# Patient Record
Sex: Male | Born: 1955 | Race: Black or African American | Hispanic: No | Marital: Married | Smoking: Never smoker
Health system: Southern US, Community
[De-identification: ages and names within clinical notes are randomized; demographics above are authoritative.]

## PROBLEM LIST (undated history)

## (undated) DIAGNOSIS — T7840XA Allergy, unspecified, initial encounter: Secondary | ICD-10-CM

## (undated) HISTORY — DX: Allergy, unspecified, initial encounter: T78.40XA

---

## 2014-10-08 ENCOUNTER — Ambulatory Visit (INDEPENDENT_AMBULATORY_CARE_PROVIDER_SITE_OTHER): Payer: Worker's Compensation | Admitting: Urgent Care

## 2014-10-08 VITALS — BP 128/82 | HR 84 | Temp 98.1°F | Resp 18

## 2014-10-08 DIAGNOSIS — S61218A Laceration without foreign body of other finger without damage to nail, initial encounter: Secondary | ICD-10-CM | POA: Insufficient documentation

## 2014-10-08 DIAGNOSIS — M79645 Pain in left finger(s): Secondary | ICD-10-CM | POA: Diagnosis not present

## 2014-10-08 NOTE — Patient Instructions (Addendum)
Laceration Care, Adult A laceration is a cut or lesion that goes through all layers of the skin and into the tissue just beneath the skin. TREATMENT  Some lacerations may not require closure. Some lacerations may not be able to be closed due to an increased risk of infection. It is important to see your caregiver as soon as possible after an injury to minimize the risk of infection and maximize the opportunity for successful closure. If closure is appropriate, pain medicines may be given, if needed. The wound will be cleaned to help prevent infection. Your caregiver will use stitches (sutures), staples, wound glue (adhesive), or skin adhesive strips to repair the laceration. These tools bring the skin edges together to allow for faster healing and a better cosmetic outcome. However, all wounds will heal with a scar. Once the wound has healed, scarring can be minimized by covering the wound with sunscreen during the day for 1 full year. HOME CARE INSTRUCTIONS  For sutures or staples:  Keep the wound clean and dry.  If you were given a bandage (dressing), you should change it at least once a day. Also, change the dressing if it becomes wet or dirty, or as directed by your caregiver.  Wash the wound with soap and water 2 times a day. Rinse the wound off with water to remove all soap. Pat the wound dry with a clean towel.  After cleaning, apply a thin layer of the antibiotic ointment as recommended by your caregiver. This will help prevent infection and keep the dressing from sticking.  You may shower as usual after the first 24 hours. Do not soak the wound in water until the sutures are removed.  Only take over-the-counter or prescription medicines for pain, discomfort, or fever as directed by your caregiver.  Get your sutures or staples removed as directed by your caregiver. For skin adhesive strips:  Keep the wound clean and dry.  Do not get the skin adhesive strips wet. You may bathe  carefully, using caution to keep the wound dry.  If the wound gets wet, pat it dry with a clean towel.  Skin adhesive strips will fall off on their own. You may trim the strips as the wound heals. Do not remove skin adhesive strips that are still stuck to the wound. They will fall off in time. For wound adhesive:  You may briefly wet your wound in the shower or bath. Do not soak or scrub the wound. Do not swim. Avoid periods of heavy perspiration until the skin adhesive has fallen off on its own. After showering or bathing, gently pat the wound dry with a clean towel.  Do not apply liquid medicine, cream medicine, or ointment medicine to your wound while the skin adhesive is in place. This may loosen the film before your wound is healed.  If a dressing is placed over the wound, be careful not to apply tape directly over the skin adhesive. This may cause the adhesive to be pulled off before the wound is healed.  Avoid prolonged exposure to sunlight or tanning lamps while the skin adhesive is in place. Exposure to ultraviolet light in the first year will darken the scar.  The skin adhesive will usually remain in place for 5 to 10 days, then naturally fall off the skin. Do not pick at the adhesive film. You may need a tetanus shot if:  You cannot remember when you had your last tetanus shot.  You have never had a tetanus   shot. If you get a tetanus shot, your arm may swell, get red, and feel warm to the touch. This is common and not a problem. If you need a tetanus shot and you choose not to have one, there is a rare chance of getting tetanus. Sickness from tetanus can be serious. SEEK MEDICAL CARE IF:   You have redness, swelling, or increasing pain in the wound.  You see a red line that goes away from the wound.  You have yellowish-white fluid (pus) coming from the wound.  You have a fever.  You notice a bad smell coming from the wound or dressing.  Your wound breaks open before or  after sutures have been removed.  You notice something coming out of the wound such as wood or glass.  Your wound is on your hand or foot and you cannot move a finger or toe. SEEK IMMEDIATE MEDICAL CARE IF:   Your pain is not controlled with prescribed medicine.  You have severe swelling around the wound causing pain and numbness or a change in color in your arm, hand, leg, or foot.  Your wound splits open and starts bleeding.  You have worsening numbness, weakness, or loss of function of any joint around or beyond the wound.  You develop painful lumps near the wound or on the skin anywhere on your body. MAKE SURE YOU:   Understand these instructions.  Will watch your condition.  Will get help right away if you are not doing well or get worse. Document Released: 01/22/2005 Document Revised: 04/16/2011 Document Reviewed: 07/18/2010 ExitCare Patient Information 2015 [employer]. This information is not intended to replace advice given to you by your health care provider. Make sure you discuss any questions you have with your health care provider.  Tdap Vaccine (Tetanus, Diphtheria, Pertussis): What You Need to Know 1. Why get vaccinated? Tetanus, diphtheria and pertussis can be very serious diseases, even for adolescents and adults. Tdap vaccine can protect us from these diseases. TETANUS (Lockjaw) causes painful muscle tightening and stiffness, usually all over the body.  It can lead to tightening of muscles in the head and neck so you can't open your mouth, swallow, or sometimes even breathe. Tetanus kills about 1 out of 5 people who are infected. DIPHTHERIA can cause a thick coating to form in the back of the throat.  It can lead to breathing problems, paralysis, heart failure, and death. PERTUSSIS (Whooping Cough) causes severe coughing spells, which can cause difficulty breathing, vomiting and disturbed sleep.  It can also lead to weight loss, incontinence, and rib  fractures. Up to 2 in 100 adolescents and 5 in 100 adults with pertussis are hospitalized or have complications, which could include pneumonia or death. These diseases are caused by bacteria. Diphtheria and pertussis are spread from person to person through coughing or sneezing. Tetanus enters the body through cuts, scratches, or wounds. Before vaccines, the [place] saw as many as 200,000 cases a year of diphtheria and pertussis, and hundreds of cases of tetanus. Since vaccination began, tetanus and diphtheria have dropped by about 99% and pertussis by about 80%. 2. Tdap vaccine Tdap vaccine can protect adolescents and adults from tetanus, diphtheria, and pertussis. One dose of Tdap is routinely given at age 11 or 12. People who did not get Tdap at that age should get it as soon as possible. Tdap is especially important for health care professionals and anyone having close contact with a baby younger than 12 months. Pregnant   women should get a dose of Tdap during every pregnancy, to protect the newborn from pertussis. Infants are most at risk for severe, life-threatening complications from pertussis. A similar vaccine, called Td, protects from tetanus and diphtheria, but not pertussis. A Td booster should be given every 10 years. Tdap may be given as one of these boosters if you have not already gotten a dose. Tdap may also be given after a severe cut or burn to prevent tetanus infection. Your doctor can give you more information. Tdap may safely be given at the same time as other vaccines. 3. Some people should not get this vaccine  If you ever had a life-threatening allergic reaction after a dose of any tetanus, diphtheria, or pertussis containing vaccine, OR if you have a severe allergy to any part of this vaccine, you should not get Tdap. Tell your doctor if you have any severe allergies.  If you had a coma, or long or multiple seizures within 7 days after a childhood dose of DTP or DTaP, you  should not get Tdap, unless a cause other than the vaccine was found. You can still get Td.  Talk to your doctor if you:  have epilepsy or another nervous system problem,  had severe pain or swelling after any vaccine containing diphtheria, tetanus or pertussis,  ever had Guillain-Barr Syndrome (GBS),  aren't feeling well on the day the shot is scheduled. 4. Risks of a vaccine reaction With any medicine, including vaccines, there is a chance of side effects. These are usually mild and go away on their own, but serious reactions are also possible. Brief fainting spells can follow a vaccination, leading to injuries from falling. Sitting or lying down for about 15 minutes can help prevent these. Tell your doctor if you feel dizzy or light-headed, or have vision changes or ringing in the ears. Mild problems following Tdap (Did not interfere with activities)  Pain where the shot was given (about 3 in 4 adolescents or 2 in 3 adults)  Redness or swelling where the shot was given (about 1 person in 5)  Mild fever of at least 100.4F (up to about 1 in 25 adolescents or 1 in 100 adults)  Headache (about 3 or 4 people in 10)  Tiredness (about 1 person in 3 or 4)  Nausea, vomiting, diarrhea, stomach ache (up to 1 in 4 adolescents or 1 in 10 adults)  Chills, body aches, sore joints, rash, swollen glands (uncommon) Moderate problems following Tdap (Interfered with activities, but did not require medical attention)  Pain where the shot was given (about 1 in 5 adolescents or 1 in 100 adults)  Redness or swelling where the shot was given (up to about 1 in 16 adolescents or 1 in 25 adults)  Fever over 102F (about 1 in 100 adolescents or 1 in 250 adults)  Headache (about 3 in 20 adolescents or 1 in 10 adults)  Nausea, vomiting, diarrhea, stomach ache (up to 1 or 3 people in 100)  Swelling of the entire arm where the shot was given (up to about 3 in 100). Severe problems following  Tdap (Unable to perform usual activities; required medical attention)  Swelling, severe pain, bleeding and redness in the arm where the shot was given (rare). A severe allergic reaction could occur after any vaccine (estimated less than 1 in a million doses). 5. What if there is a serious reaction? What should I look for?  Look for anything that concerns you, such as signs   of a severe allergic reaction, very high fever, or behavior changes. Signs of a severe allergic reaction can include hives, swelling of the face and throat, difficulty breathing, a fast heartbeat, dizziness, and weakness. These would start a few minutes to a few hours after the vaccination. What should I do?  If you think it is a severe allergic reaction or other emergency that can't wait, call 9-1-1 or get the person to the nearest hospital. Otherwise, call your doctor.  Afterward, the reaction should be reported to the "Vaccine Adverse Event Reporting System" (VAERS). Your doctor might file this report, or you can do it yourself through the VAERS web site at www.vaers.hhs.gov, or by calling 1-800-822-7967. VAERS is only for reporting reactions. They do not give medical advice.  6. The National Vaccine Injury Compensation Program The National Vaccine Injury Compensation Program (VICP) is a federal program that was created to compensate people who may have been injured by certain vaccines. Persons who believe they may have been injured by a vaccine can learn about the program and about filing a claim by calling 1-800-338-2382 or visiting the VICP website at www.hrsa.gov/vaccinecompensation. 7. How can I learn more?  Ask your doctor.  Call your local or state health department.  Contact the Centers for Disease Control and Prevention (CDC):  Call 1-800-232-4636 or visit CDC's website at www.cdc.gov/vaccines. CDC Tdap Vaccine VIS (06/14/11) Document Released: 07/24/2011 Document Revised: 06/08/2013 Document Reviewed:  05/06/2013 ExitCare Patient Information 2015 [employer]. This information is not intended to replace advice given to you by your health care provider. Make sure you discuss any questions you have with your health care provider.  

## 2014-10-08 NOTE — Progress Notes (Signed)
    MRN: [id number] DOB: 01/21/1956  Subjective:   Adam Campbell is a 59 y.o. male presenting for chief complaint of Finger Injury  Reports laceration today at work. Patient was using a tomato slicer and suffered a laceration to his left 3rd finger. He bled profusely, applied gauze and came straight to [facility]. Denies decreased range of motion, decreased sensation, foreign body sensation. He cannot recall his last TD. Denies any other aggravating or relieving factors, no other questions or concerns.  [name]'s medications list, allergies, past medical history and past surgical history were reviewed and excluded from this note due to being a worker's comp case.  Objective:   Vitals: BP 128/82 mmHg  Pulse 84  Temp(Src) 98.1 F (36.7 C) (Oral)  Resp 18  SpO2 98%  Physical Exam  Constitutional: He is oriented to person, place, and time. He appears well-developed and well-nourished.  Cardiovascular: Normal rate.   Pulmonary/Chest: Effort normal.  Musculoskeletal:       Hands: Neurological: He is alert and oriented to person, place, and time.  Skin: Skin is warm and dry. No rash noted. No erythema. No pallor.   PROCEDURE NOTE: laceration repair Verbal consent obtained from patient.  Local anesthesia with 3cc Lidocaine 2% without epinephrine.  Wound explored for tendon, ligament damage. Wound scrubbed with soap and water and rinsed. Wound closed with #5 4-0 Ethilon simple interrupted sutures.  Wound cleansed and dressed.  Assessment and Plan :   1. Laceration of middle finger, initial encounter - Stable, wound care reviewed and instructions provided, work note provided. Patient to return to clinic in 10 days for suture removal or sooner if signs of infection develop.  Wallis Bamberg, PA-C [facility] [phone] 10/08/2014 3:41 PM

## 2014-10-11 ENCOUNTER — Ambulatory Visit (INDEPENDENT_AMBULATORY_CARE_PROVIDER_SITE_OTHER): Payer: Worker's Compensation | Admitting: Family Medicine

## 2014-10-11 ENCOUNTER — Ambulatory Visit: Payer: Worker's Compensation

## 2014-10-11 VITALS — BP 136/88 | HR 81 | Temp 98.5°F | Resp 20 | Ht 67.25 in | Wt 229.8 lb

## 2014-10-11 DIAGNOSIS — M25442 Effusion, left hand: Secondary | ICD-10-CM

## 2014-10-11 DIAGNOSIS — S61218D Laceration without foreign body of other finger without damage to nail, subsequent encounter: Secondary | ICD-10-CM

## 2014-10-11 DIAGNOSIS — M7989 Other specified soft tissue disorders: Secondary | ICD-10-CM | POA: Diagnosis not present

## 2014-10-11 DIAGNOSIS — S61218A Laceration without foreign body of other finger without damage to nail, initial encounter: Secondary | ICD-10-CM | POA: Insufficient documentation

## 2014-10-11 MED ORDER — NAPROXEN SODIUM 550 MG PO TABS: 550.0000 mg | ORAL_TABLET | Freq: Two times a day (BID) | ORAL | Status: AC

## 2014-10-11 MED ORDER — NAPROXEN SODIUM 550 MG PO TABS: 550.0000 mg | ORAL_TABLET | Freq: Two times a day (BID) | ORAL | Status: DC

## 2014-10-11 MED ORDER — TETRACYCLINE HCL 250 MG PO CAPS: 250.0000 mg | ORAL_CAPSULE | Freq: Three times a day (TID) | ORAL | Status: DC

## 2014-10-11 NOTE — Patient Instructions (Signed)

## 2014-10-11 NOTE — Progress Notes (Signed)
    MRN: [id number] DOB: 07-05-1955  Subjective:   Adam Campbell is a 59 y.o. male presenting for chief complaint of Follow-up  Patient seen for finger laceration of 3rd left finger sustained while at work on 10/08/2014. He has protected his hand while at work, keeps it elevated at night. Today, reports that his 3rd finger is swelling, cannot extend it fully. Denies fever, tingling, numbness, redness, drainage of pus or bleeding. Denies any other aggravating or relieving factors, no other questions or concerns.  Dewitt's medications list, allergies, past medical history and past surgical history were reviewed and excluded from this note due to being a worker's comp case.  Objective:   Vitals: BP 136/88 mmHg  Pulse 81  Temp(Src) 98.5 F (36.9 C) (Oral)  Resp 20  Ht 5' 7.25" (1.708 m)  Wt 229 lb 12.8 oz (104.237 kg)  BMI 35.73 kg/m2  SpO2 96%  Physical Exam  Constitutional: He is oriented to person, place, and time. He appears well-developed and well-nourished.  Cardiovascular: Normal rate.   Pulmonary/Chest: Effort normal.  Musculoskeletal:       Left hand: He exhibits decreased range of motion (extension), tenderness (over dorsal aspect of finger, proximal to MCP), laceration and swelling (between MCP up to DIP). He exhibits no bony tenderness, normal capillary refill and no deformity. Normal sensation noted. Normal strength noted.       Hands: Neurological: He is alert and oriented to person, place, and time.  Skin: Skin is warm and dry. No rash noted. No erythema. No pallor.   [facility] reading (PRIMARY) by  Dr. Milus Campbell and PA-Adam Campbell. Left 3rd finger - soft tissue swelling, no signs of osteomyelitis.  Assessment and Plan :   1. Laceration of finger, middle, subsequent encounter 2. Swelling of finger, left - Stable, will cover for infectious process with tetracycline for 7 days. Provide patient with prescriptions. Advised that he start Anaprox for pain and inflammation. Recheck  on 10/18/2014 or sooner if patient develops fever, drainage of pus, bleeding, redness, streaking. Patient agreed.  Adam Bamberg, PA-C [facility] [phone] 10/11/2014 11:08 AM   History and physical exam reviewed with Mr. Adam Campbell, agree with plan and assessment.  Signed, Adam Campbell.D.

## 2014-10-19 ENCOUNTER — Ambulatory Visit (INDEPENDENT_AMBULATORY_CARE_PROVIDER_SITE_OTHER): Payer: Worker's Compensation | Admitting: Family Medicine

## 2014-10-19 VITALS — BP 130/82 | HR 85 | Temp 98.3°F | Resp 16 | Ht 67.5 in | Wt 232.0 lb

## 2014-10-19 DIAGNOSIS — M7989 Other specified soft tissue disorders: Secondary | ICD-10-CM | POA: Diagnosis not present

## 2014-10-19 DIAGNOSIS — S61218D Laceration without foreign body of other finger without damage to nail, subsequent encounter: Secondary | ICD-10-CM | POA: Diagnosis not present

## 2014-10-19 MED ORDER — AMOXICILLIN-POT CLAVULANATE 875-125 MG PO TABS: 1.0000 | ORAL_TABLET | Freq: Two times a day (BID) | ORAL | Status: AC

## 2014-10-19 NOTE — Progress Notes (Signed)
Wound finger Subjective:  Patient ID: Adam Campbell, male    DOB: 1955-03-14  Age: 59 y.o. MRN: [id number]  Patient was in here and being rechecked by Urban Gibson, PA-C for a laceration of his third finger left hand right over the PIP joint. We discussed the problem and I examined the patient with him. He had been treated here, sutures have been removed last week, and treated with doxycycline.   Objective:   Mildly erythematous PIP knuckle with healed laceration extending almost 2 cm is across the knuckle of the joint. It is a little bit tender, mildly fluctuant. Cannot fully flex the joint but especially cannot fully extend it.  Assessment & Plan:   Assessment:  Persistently swollen and mildly erythematous finger  Plan:  Change to Augmentin. Refer to hand surgery. Treatment plan discussed and agreed upon. Come in sooner if problems.   Rafel Garde, MD 10/19/2014

## 2014-10-19 NOTE — Patient Instructions (Signed)
Wound Infection °A wound infection happens when a type of germ (bacteria) starts growing in the wound. In some cases, this can cause the wound to break open. If cared for properly, the infected wound will heal from the inside to the outside. Wound infections need treatment. °CAUSES °An infection is caused by bacteria growing in the wound.  °SYMPTOMS  °· Increase in redness, swelling, or pain at the wound site. °· Increase in drainage at the wound site. °· Wound or bandage (dressing) starts to smell bad. °· Fever. °· Feeling tired or fatigued. °· Pus draining from the wound. °TREATMENT  °Your health care provider will prescribe antibiotic medicine. The wound infection should improve within 24 to 48 hours. Any redness around the wound should stop spreading and the wound should be less painful.  °HOME CARE INSTRUCTIONS  °· Only take over-the-counter or prescription medicines for pain, discomfort, or fever as directed by your health care provider. °· Take your antibiotics as directed. Finish them even if you start to feel better. °· Gently wash the area with mild soap and water 2 times a day, or as directed. Rinse off the soap. Pat the area dry with a clean towel. Do not rub the wound. This may cause bleeding. °· Follow your health care provider's instructions for how often you need to change the dressing. °· Apply ointment and a dressing to the wound as directed. °· If the dressing sticks, moisten it with soapy water and gently remove it. °· Change the bandage right away if it becomes wet, dirty, or develops a bad smell. °· Take showers. Do not take tub baths, swim, or do anything that may soak the wound until it is healed. °· Avoid exercises that make you sweat heavily. °· Use anti-itch medicine as directed by your health care provider. The wound may itch when it is healing. Do not pick or scratch at the wound. °· Follow up with your health care provider to get your wound rechecked as directed. °SEEK MEDICAL CARE  IF: °· You have an increase in swelling, pain, or redness around the wound. °· You have an increase in the amount of pus coming from the wound. °· There is a bad smell coming from the wound. °· More of the wound breaks open. °· You have a fever. °MAKE SURE YOU:  °· Understand these instructions. °· Will watch your condition. °· Will get help right away if you are not doing well or get worse. °Document Released: 10/21/2002 Document Revised: 01/27/2013 Document Reviewed: 05/28/2010 °ExitCare® Patient Information ©2015 [employer]. This information is not intended to replace advice given to you by your health care provider. Make sure you discuss any questions you have with your health care provider. ° °

## 2014-10-19 NOTE — Progress Notes (Signed)
    MRN: [id number] DOB: November 07, 1955  Subjective:   Adam Campbell is a 59 y.o. male presenting for follow up on left third finger laceration. Reports that his finger remains swollen and has difficulty with full extension. Has tried Anaprox, icing and antibiotic prescribed. Denies fever, pain, redness, drainage of pus or bleeding, numbness or tingling. Denies any other aggravating or relieving factors, no other questions or concerns.  Adam Campbell has been taking Anaprox and tetracycline.  Patient's past medical history and past surgical history reviewed and not included due to being a worker's comp case.   Objective:   Vitals: BP 130/82 mmHg  Pulse 85  Temp(Src) 98.3 F (36.8 C) (Oral)  Resp 16  Ht 5' 7.5" (1.715 m)  Wt 232 lb (105.235 kg)  BMI 35.78 kg/m2  SpO2 98%  Physical Exam  Constitutional: He is oriented to person, place, and time. He appears well-developed and well-nourished.  Cardiovascular: Normal rate.   Pulmonary/Chest: Effort normal.  Musculoskeletal:       Left hand: He exhibits decreased range of motion (3rd finger extension and flexion), laceration and swelling (between MCP up to DIP). He exhibits no tenderness, no bony tenderness, normal capillary refill and no deformity. Normal sensation noted. Normal strength noted.       Hands: Neurological: He is alert and oriented to person, place, and time.  Skin: Skin is warm and dry. No rash noted. No erythema. No pallor.   Assessment and Plan :   1. Laceration of finger, middle, subsequent encounter 2. Swelling of finger, left - Physical exam findings worrisome for persistent infection, will cover with Augmentin. - Referral to hand specialist pending.  Wallis Bamberg, PA-C [facility] [phone] 10/19/2014 3:39 PM

## 2017-02-14 IMAGING — CR DG FINGER MIDDLE 2+V*L*
2 series · 2 of 2 positions shown · non-contrast
Comparison: None.

CLINICAL DATA: 59-year-old male with soft tissue swelling involving
the left middle finger.

EXAM:
LEFT MIDDLE FINGER 2+V

[PA]
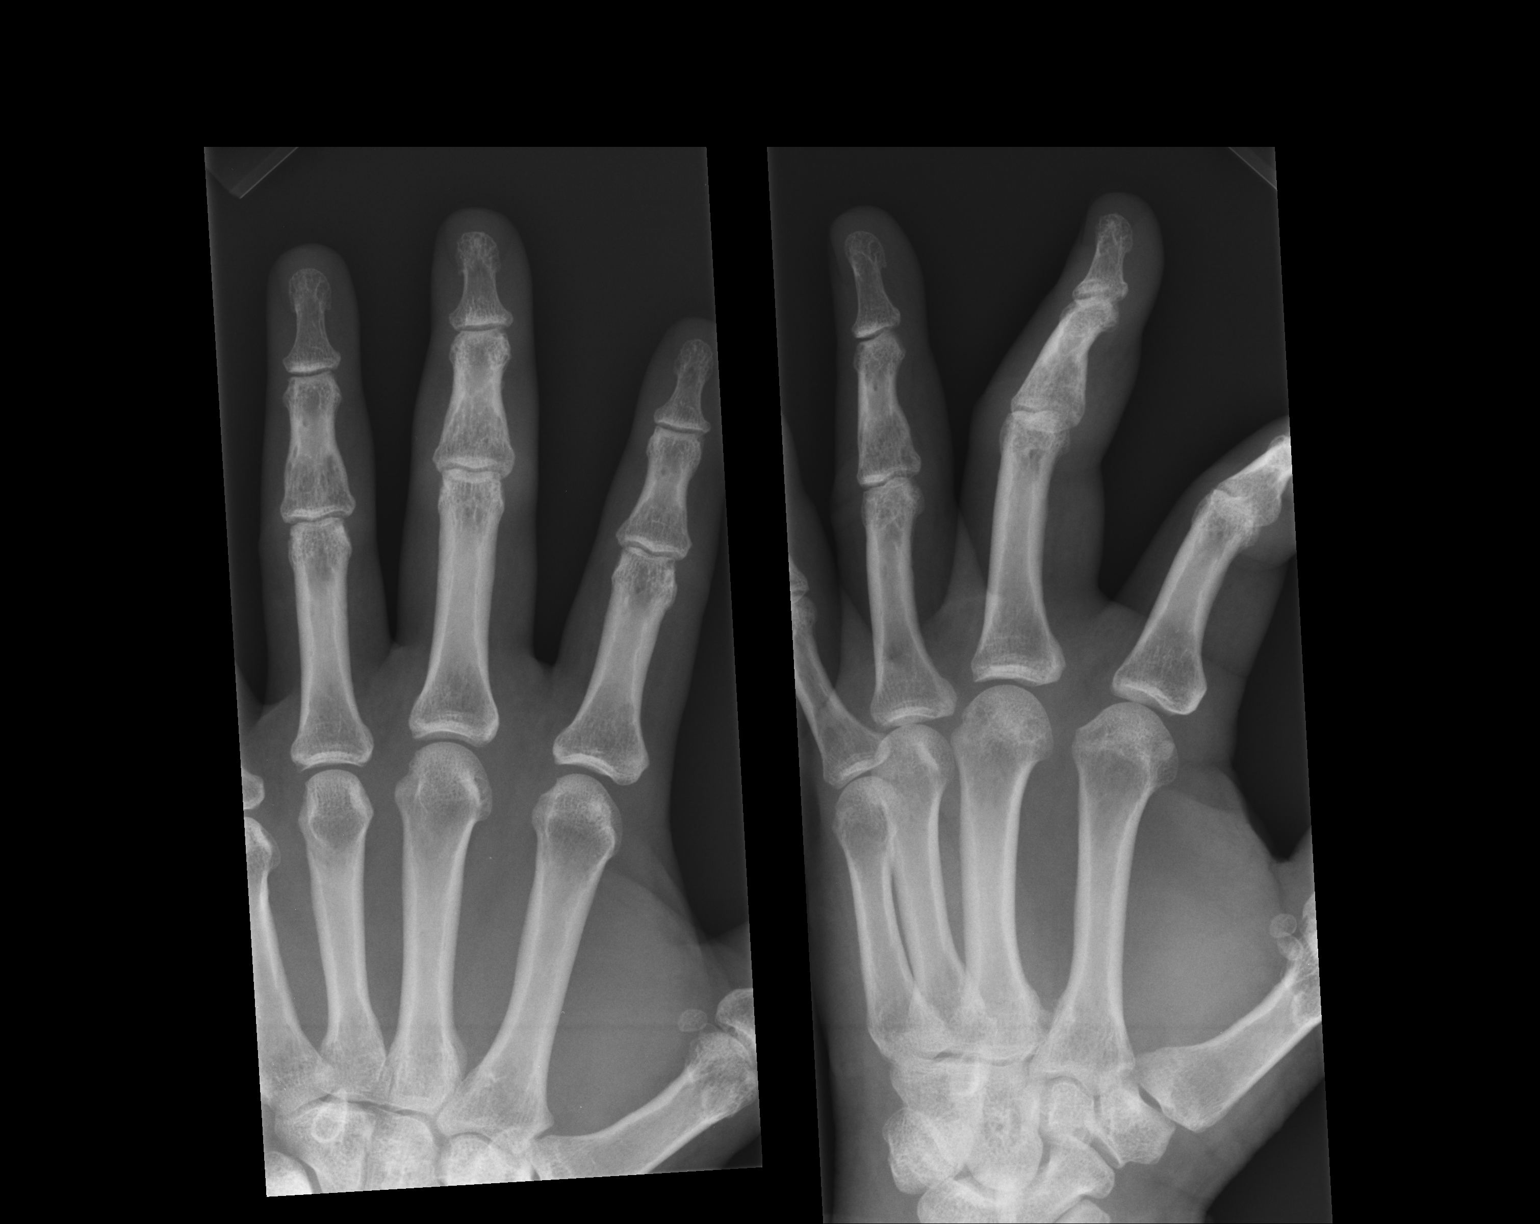

[lateral]
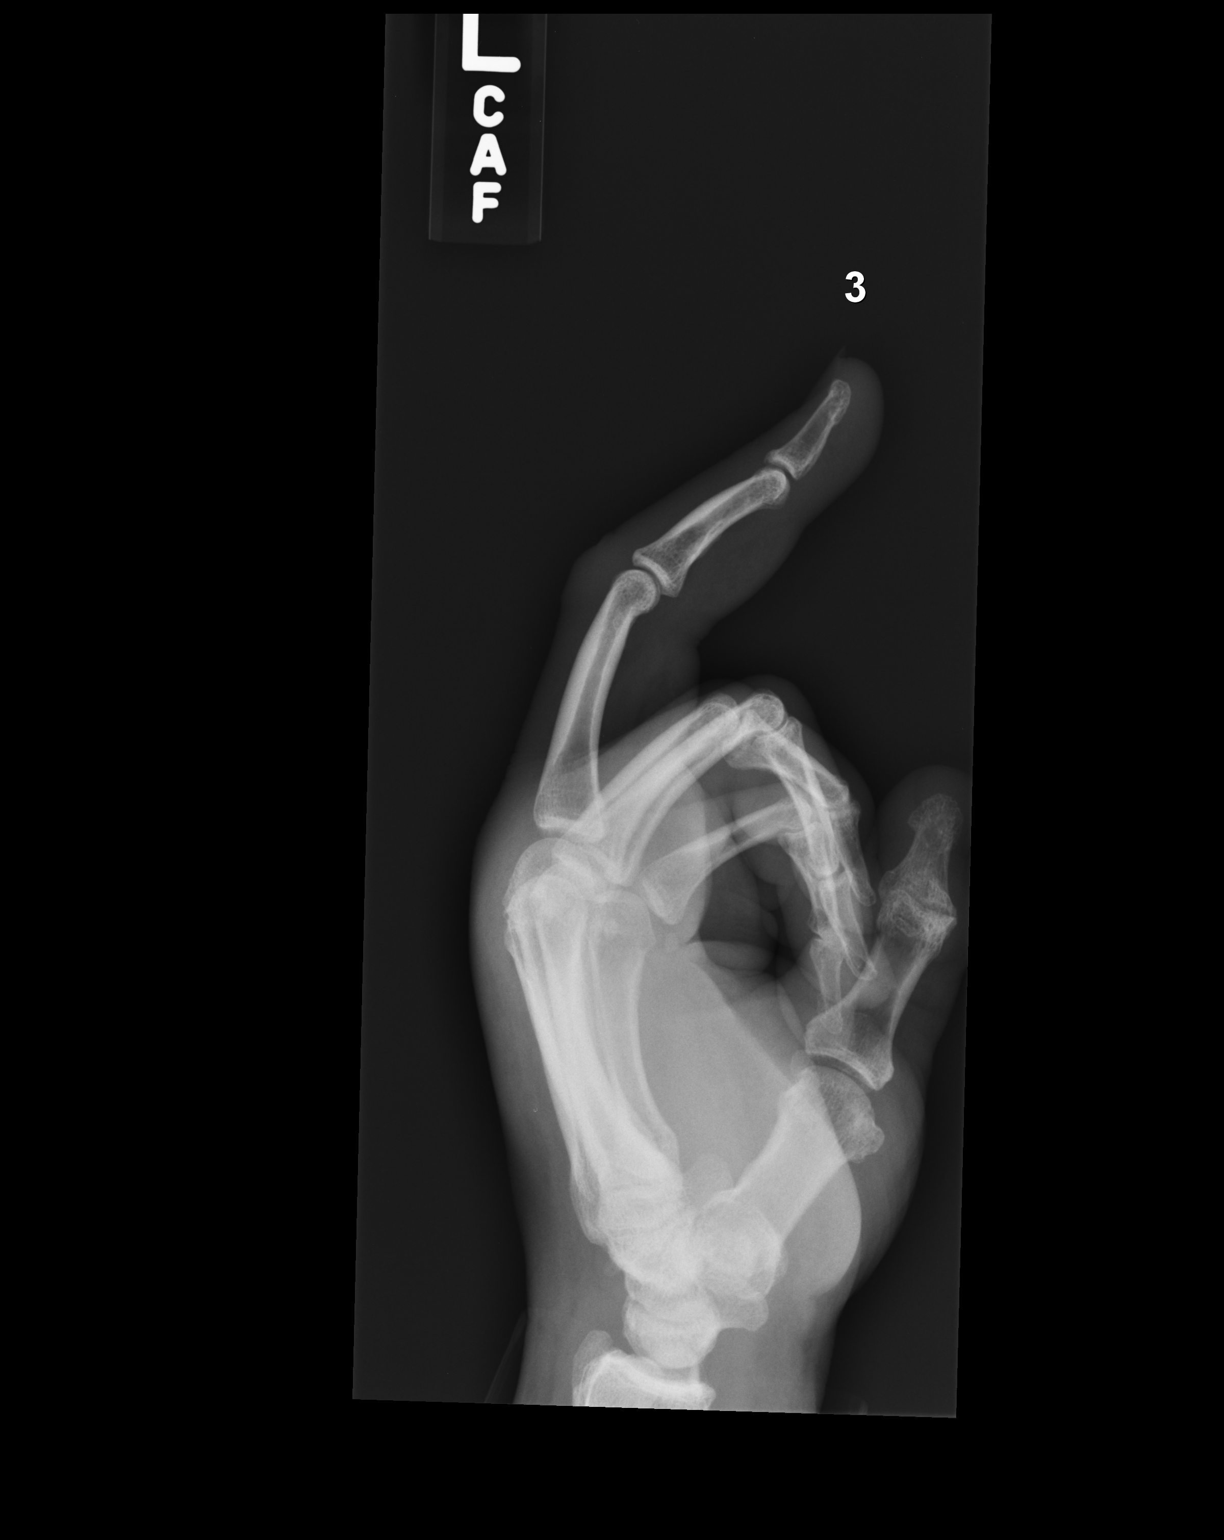

[2 of 2 positions shown; findings below may reference images not displayed]

FINDINGS: Mild fusiform swelling of the soft tissues of the middle digit
centered at the proximal interphalangeal joint. There is no evidence
of associated fracture, malalignment, destructive bony change or
imbedded radiopaque foreign body. The remaining visualized bones and
joints are also unremarkable
IMPRESSION: Fusiform soft tissue swelling centered about the proximal
interphalangeal joint of the long finger without evidence of
fracture, malalignment, osteomyelitis or imbedded foreign body.

## 2022-06-22 ENCOUNTER — Other Ambulatory Visit (HOSPITAL_BASED_OUTPATIENT_CLINIC_OR_DEPARTMENT_OTHER): Payer: Self-pay
